# Patient Record
Sex: Male | Born: 1945 | Race: White | Hispanic: No | Marital: Married | Smoking: Never smoker
Health system: Southern US, Community
[De-identification: ages and names within clinical notes are randomized; demographics above are authoritative.]

## PROBLEM LIST (undated history)

## (undated) DIAGNOSIS — J45909 Unspecified asthma, uncomplicated: Secondary | ICD-10-CM

## (undated) DIAGNOSIS — N400 Enlarged prostate without lower urinary tract symptoms: Secondary | ICD-10-CM

## (undated) DIAGNOSIS — E7800 Pure hypercholesterolemia, unspecified: Secondary | ICD-10-CM

## (undated) HISTORY — PX: PROSTATE SURGERY: SHX751

## (undated) HISTORY — PX: CATARACT EXTRACTION: SUR2

---

## 2011-01-22 ENCOUNTER — Ambulatory Visit
Admission: RE | Admit: 2011-01-22 | Discharge: 2011-01-22 | Disposition: A | Discharge status: Home / Self Care | Payer: Medicare Other | Source: Ambulatory Visit | Attending: Orthopedic Surgery | Admitting: Orthopedic Surgery

## 2011-01-22 ENCOUNTER — Other Ambulatory Visit: Payer: Self-pay | Admitting: Orthopedic Surgery

## 2011-01-22 DIAGNOSIS — R52 Pain, unspecified: Secondary | ICD-10-CM

## 2011-02-11 DEATH — deceased

## 2012-06-09 IMAGING — CT CT CHEST W/O CM
2 of 5 series · 15 of 36 positions shown, 18 images · non-contrast
Comparison: None.

CLINICAL DATA: Left sternal and rib pain.  Evaluate for fracture.

CT CHEST WITHOUT CONTRAST
TECHNIQUE: Multidetector CT imaging of the chest was performed
following the standard protocol without IV contrast.

[Series 102: chest bone windows · axial · 0.70mm/px · z∈[-265,+2]mm · 12 of 127 slices shown, 15 images]
[im 10/127  mediastinal]
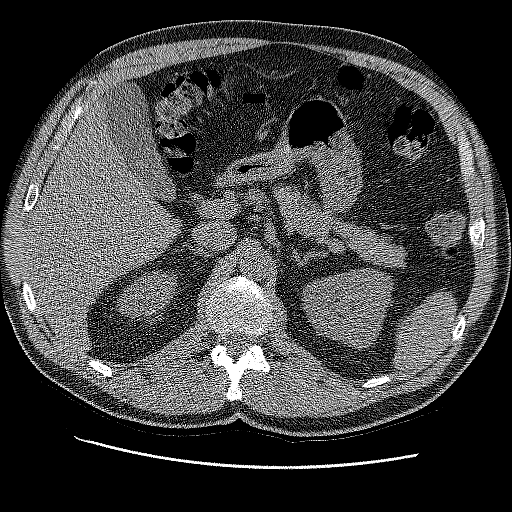
[im 10/127  lung]
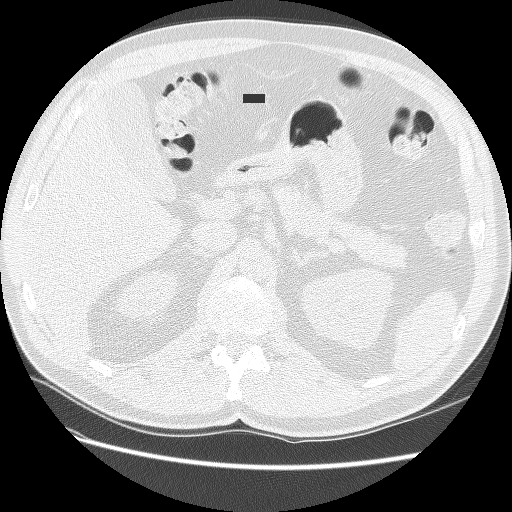
[im 20/127  lung]
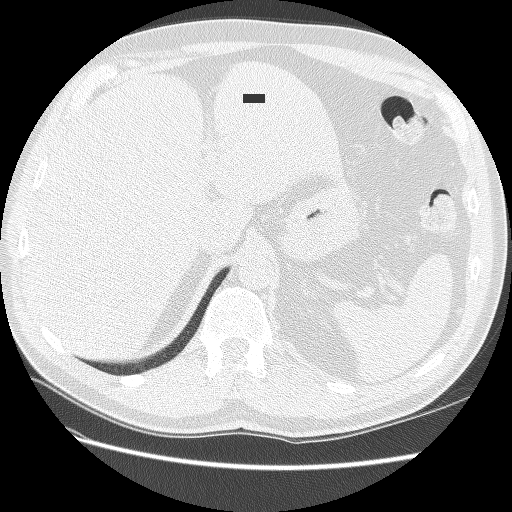
[im 30/127  lung]
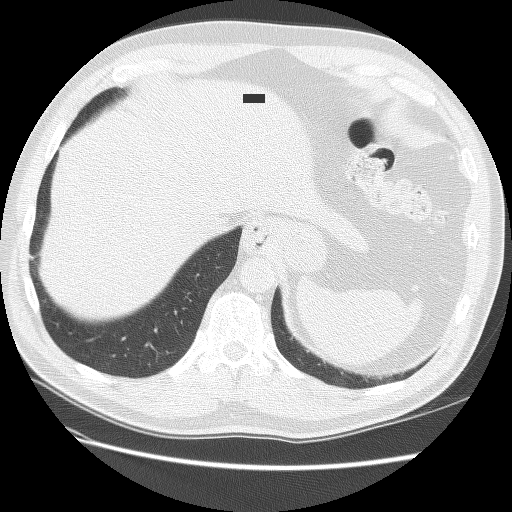
[im 39/127  lung]
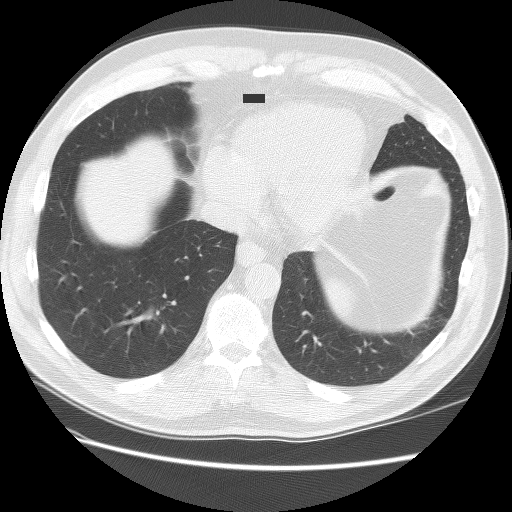
[im 49/127  mediastinal]
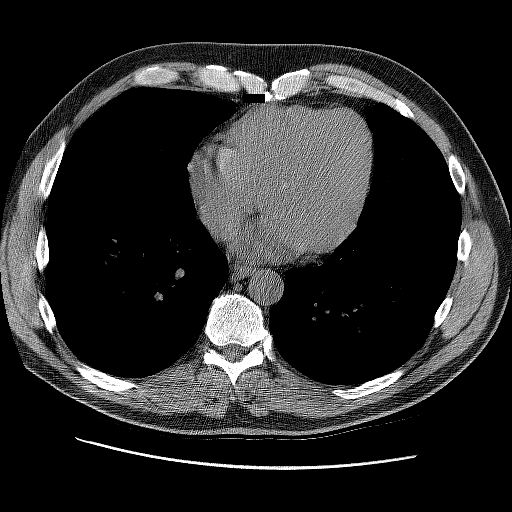
[im 49/127  lung]
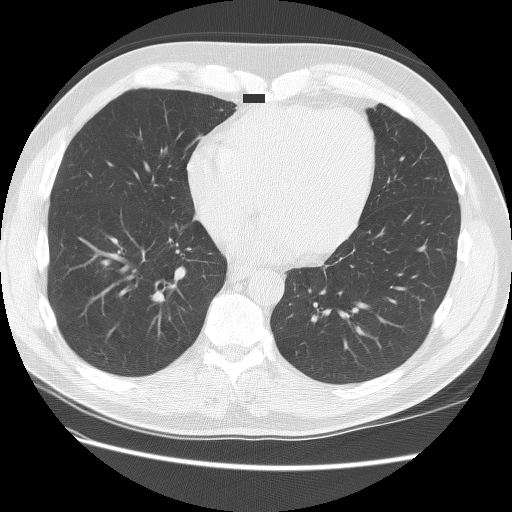
[im 59/127  lung]
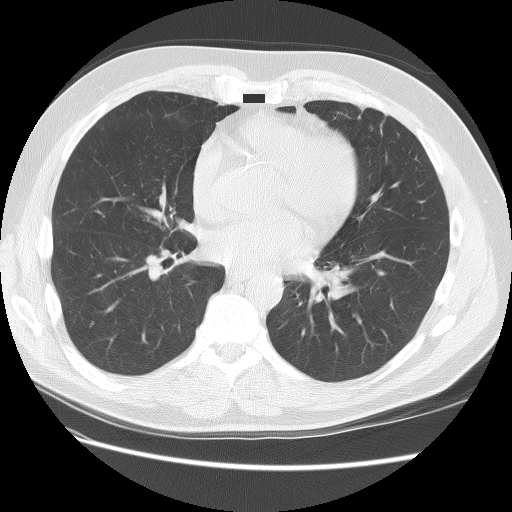
[im 68/127  lung]
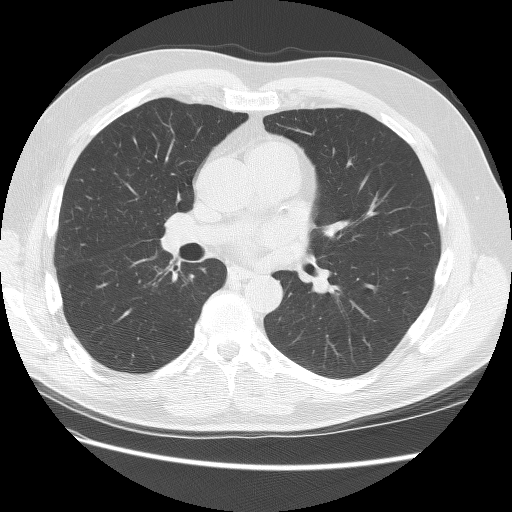
[im 78/127  lung]
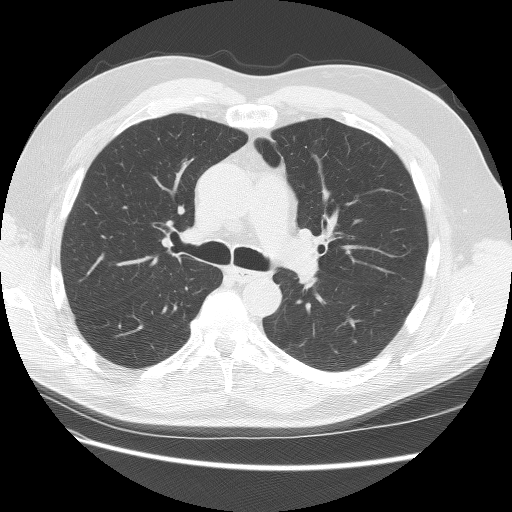
[im 88/127  mediastinal]
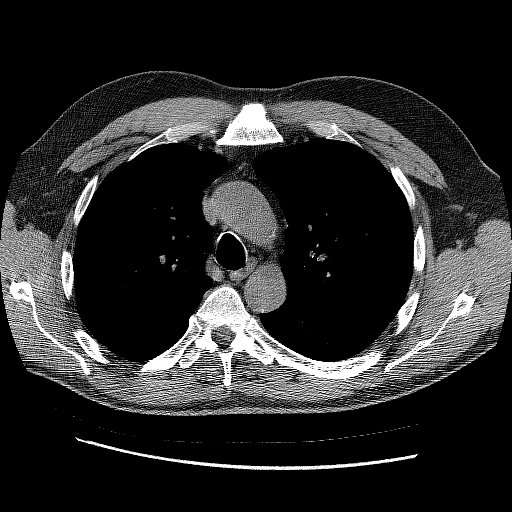
[im 88/127  lung]
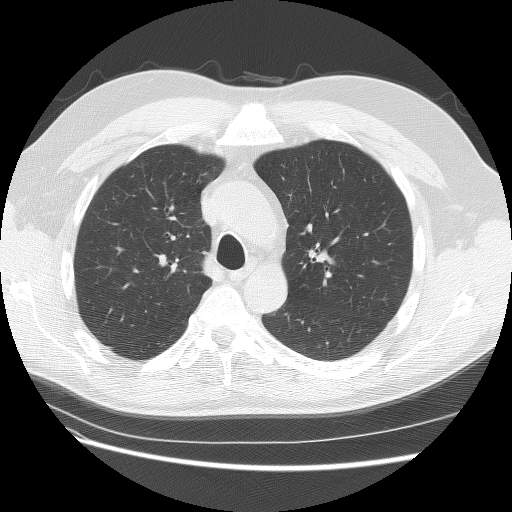
[im 97/127  lung]
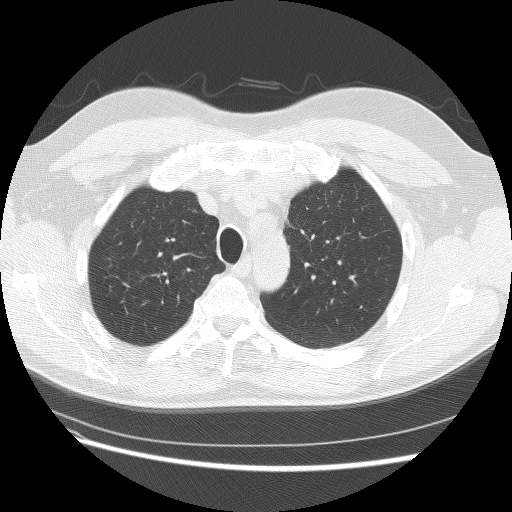
[im 107/127  lung]
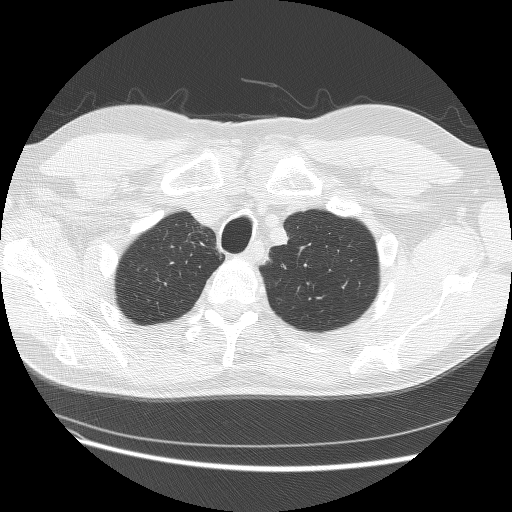
[im 117/127  lung]
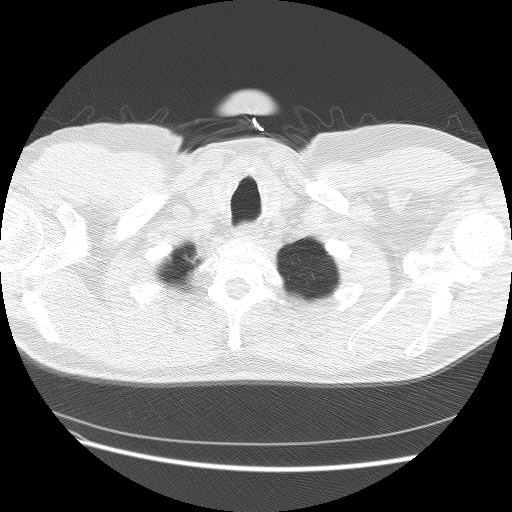

[Series 400: cor · coronal · 0.70mm/px · 3 of 118 slices shown]
[im 24/118  lung]
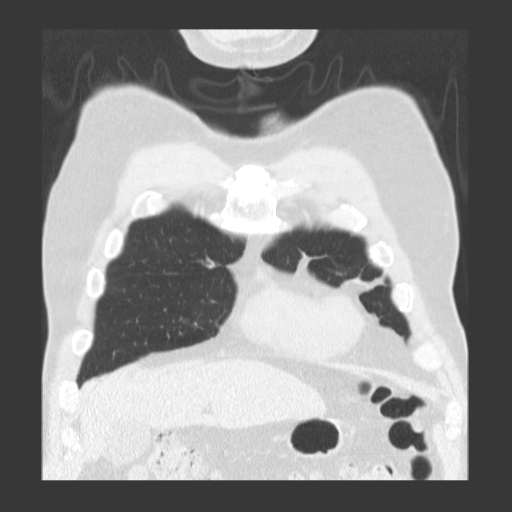
[im 47/118  lung]
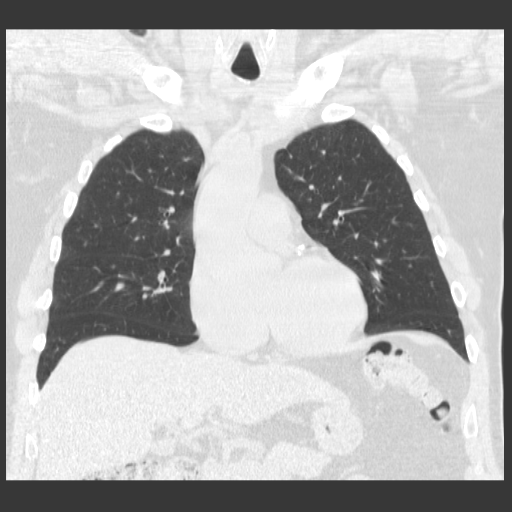
[im 71/118  lung]
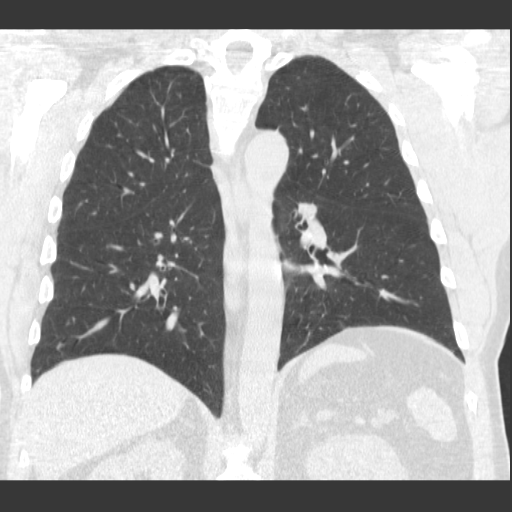

[15 of 36 positions shown; findings below may reference images not displayed]

FINDINGS: No pathologically enlarged mediastinal or axillary lymph
nodes.  Hilar regions are difficult to definitively evaluate
without IV contrast.  There is atherosclerotic calcification of the
coronary arteries, especially within the left anterior descending
artery.  Heart size normal.  No pericardial effusion.  Tiny hiatal
hernia.

Probable scarring in the right upper and right middle lobes.  Lungs
are otherwise clear.  No pleural fluid.  Airway is unremarkable.

Incidental imaging of the upper abdomen shows no acute findings.
No worrisome lytic or sclerotic lesions.  No fracture. Prominent
anterior osteophytosis is seen along the sternomanubrial junction.
IMPRESSION: 1.  No findings to explain the patient's given symptoms, other than
degenerative changes involving the sternomanubrial joint.
2.  Extensive left anterior descending coronary artery
calcification.

## 2013-10-12 DIAGNOSIS — G4733 Obstructive sleep apnea (adult) (pediatric): Secondary | ICD-10-CM | POA: Insufficient documentation

## 2013-10-12 DIAGNOSIS — H2510 Age-related nuclear cataract, unspecified eye: Secondary | ICD-10-CM | POA: Insufficient documentation

## 2013-10-12 DIAGNOSIS — IMO0001 Reserved for inherently not codable concepts without codable children: Secondary | ICD-10-CM | POA: Insufficient documentation

## 2013-10-12 DIAGNOSIS — I6529 Occlusion and stenosis of unspecified carotid artery: Secondary | ICD-10-CM | POA: Insufficient documentation

## 2013-10-12 DIAGNOSIS — K219 Gastro-esophageal reflux disease without esophagitis: Secondary | ICD-10-CM | POA: Insufficient documentation

## 2013-10-12 DIAGNOSIS — N411 Chronic prostatitis: Secondary | ICD-10-CM | POA: Insufficient documentation

## 2013-10-12 DIAGNOSIS — F339 Major depressive disorder, recurrent, unspecified: Secondary | ICD-10-CM | POA: Insufficient documentation

## 2013-10-12 DIAGNOSIS — H8110 Benign paroxysmal vertigo, unspecified ear: Secondary | ICD-10-CM | POA: Insufficient documentation

## 2013-10-12 DIAGNOSIS — H34239 Retinal artery branch occlusion, unspecified eye: Secondary | ICD-10-CM | POA: Insufficient documentation

## 2013-10-12 DIAGNOSIS — J9801 Acute bronchospasm: Secondary | ICD-10-CM | POA: Insufficient documentation

## 2013-10-12 DIAGNOSIS — G5760 Lesion of plantar nerve, unspecified lower limb: Secondary | ICD-10-CM | POA: Insufficient documentation

## 2013-10-12 DIAGNOSIS — H9190 Unspecified hearing loss, unspecified ear: Secondary | ICD-10-CM | POA: Insufficient documentation

## 2013-10-12 DIAGNOSIS — E669 Obesity, unspecified: Secondary | ICD-10-CM | POA: Insufficient documentation

## 2013-10-12 DIAGNOSIS — J309 Allergic rhinitis, unspecified: Secondary | ICD-10-CM | POA: Insufficient documentation

## 2013-10-12 DIAGNOSIS — R5383 Other fatigue: Secondary | ICD-10-CM | POA: Insufficient documentation

## 2013-10-12 DIAGNOSIS — B351 Tinea unguium: Secondary | ICD-10-CM | POA: Insufficient documentation

## 2013-10-12 DIAGNOSIS — R0989 Other specified symptoms and signs involving the circulatory and respiratory systems: Secondary | ICD-10-CM | POA: Insufficient documentation

## 2013-10-12 DIAGNOSIS — N138 Other obstructive and reflux uropathy: Secondary | ICD-10-CM | POA: Insufficient documentation

## 2013-10-12 DIAGNOSIS — N529 Male erectile dysfunction, unspecified: Secondary | ICD-10-CM | POA: Insufficient documentation

## 2013-10-12 DIAGNOSIS — R1310 Dysphagia, unspecified: Secondary | ICD-10-CM | POA: Insufficient documentation

## 2013-10-12 DIAGNOSIS — E785 Hyperlipidemia, unspecified: Secondary | ICD-10-CM | POA: Insufficient documentation

## 2013-10-12 DIAGNOSIS — H35369 Drusen (degenerative) of macula, unspecified eye: Secondary | ICD-10-CM | POA: Insufficient documentation

## 2013-10-12 DIAGNOSIS — R972 Elevated prostate specific antigen [PSA]: Secondary | ICD-10-CM | POA: Insufficient documentation

## 2013-10-12 DIAGNOSIS — J300 Vasomotor rhinitis: Secondary | ICD-10-CM | POA: Insufficient documentation

## 2013-10-12 DIAGNOSIS — J45901 Unspecified asthma with (acute) exacerbation: Secondary | ICD-10-CM | POA: Insufficient documentation

## 2013-10-12 DIAGNOSIS — N401 Enlarged prostate with lower urinary tract symptoms: Secondary | ICD-10-CM

## 2013-10-12 DIAGNOSIS — M205X9 Other deformities of toe(s) (acquired), unspecified foot: Secondary | ICD-10-CM | POA: Insufficient documentation

## 2013-10-12 DIAGNOSIS — Z9989 Dependence on other enabling machines and devices: Secondary | ICD-10-CM

## 2013-10-12 DIAGNOSIS — D649 Anemia, unspecified: Secondary | ICD-10-CM | POA: Insufficient documentation

## 2013-10-12 DIAGNOSIS — I10 Essential (primary) hypertension: Secondary | ICD-10-CM | POA: Insufficient documentation

## 2015-02-04 ENCOUNTER — Emergency Department (HOSPITAL_BASED_OUTPATIENT_CLINIC_OR_DEPARTMENT_OTHER)
Admission: EM | Admit: 2015-02-04 | Discharge: 2015-02-04 | Disposition: A | Discharge status: Home / Self Care | Payer: Medicare Other | Attending: Emergency Medicine | Admitting: Emergency Medicine

## 2015-02-04 ENCOUNTER — Encounter (HOSPITAL_BASED_OUTPATIENT_CLINIC_OR_DEPARTMENT_OTHER): Payer: Self-pay | Admitting: *Deleted

## 2015-02-04 DIAGNOSIS — Z79899 Other long term (current) drug therapy: Secondary | ICD-10-CM | POA: Diagnosis not present

## 2015-02-04 DIAGNOSIS — E7800 Pure hypercholesterolemia, unspecified: Secondary | ICD-10-CM | POA: Diagnosis not present

## 2015-02-04 DIAGNOSIS — J45909 Unspecified asthma, uncomplicated: Secondary | ICD-10-CM | POA: Diagnosis not present

## 2015-02-04 DIAGNOSIS — J069 Acute upper respiratory infection, unspecified: Secondary | ICD-10-CM | POA: Insufficient documentation

## 2015-02-04 DIAGNOSIS — B9789 Other viral agents as the cause of diseases classified elsewhere: Secondary | ICD-10-CM

## 2015-02-04 DIAGNOSIS — J029 Acute pharyngitis, unspecified: Secondary | ICD-10-CM | POA: Diagnosis present

## 2015-02-04 HISTORY — DX: Pure hypercholesterolemia, unspecified: E78.00

## 2015-02-04 HISTORY — DX: Unspecified asthma, uncomplicated: J45.909

## 2015-02-04 LAB — RAPID STREP SCREEN (MED CTR MEBANE ONLY): Streptococcus, Group A Screen (Direct): NEGATIVE

## 2015-02-04 MED ORDER — OXYMETAZOLINE HCL 0.05 % NA SOLN
1.0000 | Freq: Once | NASAL | Status: AC
  Administered 2015-02-04: 1 via NASAL
  Filled 2015-02-04: qty 15

## 2015-02-04 MED ORDER — DEXAMETHASONE 6 MG PO TABS
10.0000 mg | ORAL_TABLET | Freq: Once | ORAL | Status: AC
  Administered 2015-02-04: 10 mg via ORAL
  Filled 2015-02-04: qty 1

## 2015-02-04 NOTE — ED Notes (Signed)
Pt amb to room 5 with quick steady gait in nad. Pt reports "mild cough" x 2 weeks, last night cough worsened and he noticed a sore throat and pain with swallowing.

## 2015-02-04 NOTE — ED Provider Notes (Signed)
CSN: [id number]     Arrival date & time 02/04/15  0818 History   First MD Initiated Contact with Patient 02/04/15 0827     Chief Complaint  Patient presents with  . Sore Throat     (Consider location/radiation/quality/duration/timing/severity/associated sxs/prior Treatment) Patient is a 69 y.o. male presenting with pharyngitis. The history is provided by the patient.  Sore Throat This is a new problem. The current episode started 12 to 24 hours ago. The problem occurs constantly. The problem has been gradually improving. Pertinent negatives include no chest pain, no abdominal pain and no shortness of breath. Associated symptoms comments: Cough, nasal congestion. Nothing aggravates the symptoms. Nothing relieves the symptoms. He has tried nothing for the symptoms.    Past Medical History  Diagnosis Date  . Asthma   . Hypercholesteremia    History reviewed. No pertinent past surgical history. History reviewed. No pertinent family history. Social History  Substance Use Topics  . Smoking status: Never Smoker   . Smokeless tobacco: None  . Alcohol Use: None    Review of Systems  Respiratory: Negative for shortness of breath.   Cardiovascular: Negative for chest pain.  Gastrointestinal: Negative for abdominal pain.  All other systems reviewed and are negative.     Allergies  Review of patient's allergies indicates no known allergies.  Home Medications   Prior to Admission medications   Medication Sig Start Date End Date Taking? Authorizing Provider  lansoprazole (PREVACID) 15 MG capsule Take 15 mg by mouth daily at 12 noon.   Yes Historical Provider, MD  rosuvastatin (CRESTOR) 20 MG tablet Take 20 mg by mouth daily.   Yes Historical Provider, MD   BP 153/85 mmHg  Pulse 80  Temp(Src) 98.7 F (37.1 C) (Oral)  Resp 16  Ht  (1.753 m)  Wt 194 lb (87.998 kg)  BMI 28.64 kg/m2  SpO2 98% Physical Exam  Constitutional: He is oriented to person, place, and time. He  appears well-developed and well-nourished. No distress.  HENT:  Head: Normocephalic and atraumatic.  Mouth/Throat: Uvula is midline, oropharynx is clear and moist and mucous membranes are normal. No oropharyngeal exudate, posterior oropharyngeal edema, posterior oropharyngeal erythema or tonsillar abscesses.  Eyes: Conjunctivae are normal.  Neck: Neck supple. No tracheal deviation present.  Cardiovascular: Normal rate, regular rhythm and normal heart sounds.   Pulmonary/Chest: Effort normal and breath sounds normal. No respiratory distress.  Abdominal: Soft. He exhibits no distension.  Neurological: He is alert and oriented to person, place, and time.  Skin: Skin is warm and dry.  Psychiatric: He has a normal mood and affect.    ED Course  Procedures (including critical care time) Labs Review Labs Reviewed  RAPID STREP SCREEN (NOT AT [facility])    Imaging Review No results found. I have personally reviewed and evaluated these images and lab results as part of my medical decision-making.   EKG Interpretation None      MDM   Final diagnoses:  Viral URI with cough    69 year old male presents with cough, nasal congestion over the last week. Last night he noticed he had a very severe sore throat which appears to be resolving. Signs and symptoms consistent with postnasal drip. Provided Afrin for nasal congestion over the next 3 days. Dose of Decadron provided for sore throat. No signs of respiratory distress, non-toxic appearing, CTAB, no concern for pneumonia with this clinical picture. No emergent testing indicated at this time. Pt discharged with likely viral cough which will be  self limited in its course.     Lyndal Pulleyaniel Rudransh Bellanca, MD 02/04/15 [phone]

## 2015-02-04 NOTE — Discharge Instructions (Signed)
Upper Respiratory Infection, Adult Most upper respiratory infections (URIs) are a viral infection of the air passages leading to the lungs. A URI affects the nose, throat, and upper air passages. The most common type of URI is nasopharyngitis and is typically referred to as "the common cold." URIs run their course and usually go away on their own. Most of the time, a URI does not require medical attention, but sometimes a bacterial infection in the upper airways can follow a viral infection. This is called a secondary infection. Sinus and middle ear infections are common types of secondary upper respiratory infections. Bacterial pneumonia can also complicate a URI. A URI can worsen asthma and chronic obstructive pulmonary disease (COPD). Sometimes, these complications can require emergency medical care and may be life threatening.  CAUSES Almost all URIs are caused by viruses. A virus is a type of germ and can spread from one person to another.  RISKS FACTORS You may be at risk for a URI if:   You smoke.   You have chronic heart or lung disease.  You have a weakened defense (immune) system.   You are very young or very old.   You have nasal allergies or asthma.  You work in crowded or poorly ventilated areas.  You work in health care facilities or schools. SIGNS AND SYMPTOMS  Symptoms typically develop 2-3 days after you come in contact with a cold virus. Most viral URIs last 7-10 days. However, viral URIs from the influenza virus (flu virus) can last 14-18 days and are typically more severe. Symptoms may include:   Runny or stuffy (congested) nose.   Sneezing.   Cough.   Sore throat.   Headache.   Fatigue.   Fever.   Loss of appetite.   Pain in your forehead, behind your eyes, and over your cheekbones (sinus pain).  Muscle aches.  DIAGNOSIS  Your health care provider may diagnose a URI by:  Physical exam.  Tests to check that your symptoms are not due to  another condition such as:  Strep throat.  Sinusitis.  Pneumonia.  Asthma. TREATMENT  A URI goes away on its own with time. It cannot be cured with medicines, but medicines may be prescribed or recommended to relieve symptoms. Medicines may help:  Reduce your fever.  Reduce your cough.  Relieve nasal congestion. HOME CARE INSTRUCTIONS   Take medicines only as directed by your health care provider.   Gargle warm saltwater or take cough drops to comfort your throat as directed by your health care provider.  Use a warm mist humidifier or inhale steam from a shower to increase air moisture. This may make it easier to breathe.  Drink enough fluid to keep your urine clear or pale yellow.   Eat soups and other clear broths and maintain good nutrition.   Rest as needed.   Return to work when your temperature has returned to normal or as your health care provider advises. You may need to stay home longer to avoid infecting others. You can also use a face mask and careful hand washing to prevent spread of the virus.  Increase the usage of your inhaler if you have asthma.   Do not use any tobacco products, including cigarettes, chewing tobacco, or electronic cigarettes. If you need help quitting, ask your health care provider. PREVENTION  The best way to protect yourself from getting a cold is to practice good hygiene.   Avoid oral or hand contact with people with cold   symptoms.   Wash your hands often if contact occurs.  There is no clear evidence that vitamin C, vitamin E, echinacea, or exercise reduces the chance of developing a cold. However, it is always recommended to get plenty of rest, exercise, and practice good nutrition.  SEEK MEDICAL CARE IF:   You are getting worse rather than better.   Your symptoms are not controlled by medicine.   You have chills.  You have worsening shortness of breath.  You have brown or red mucus.  You have yellow or brown nasal  discharge.  You have pain in your face, especially when you bend forward.  You have a fever.  You have swollen neck glands.  You have pain while swallowing.  You have white areas in the back of your throat. SEEK IMMEDIATE MEDICAL CARE IF:   You have severe or persistent:  Headache.  Ear pain.  Sinus pain.  Chest pain.  You have chronic lung disease and any of the following:  Wheezing.  Prolonged cough.  Coughing up blood.  A change in your usual mucus.  You have a stiff neck.  You have changes in your:  Vision.  Hearing.  Thinking.  Mood. MAKE SURE YOU:   Understand these instructions.  Will watch your condition.  Will get help right away if you are not doing well or get worse.   This information is not intended to replace advice given to you by your health care provider. Make sure you discuss any questions you have with your health care provider.   Document Released: 07/23/2000 Document Revised: 06/13/2014 Document Reviewed: 05/04/2013 Elsevier Interactive Patient Education 2016 [employer]vier Inc.  

## 2015-02-06 LAB — CULTURE, GROUP A STREP: STREP A CULTURE: NEGATIVE

## 2015-05-14 DIAGNOSIS — M2022 Hallux rigidus, left foot: Secondary | ICD-10-CM | POA: Insufficient documentation

## 2015-05-14 DIAGNOSIS — M2021 Hallux rigidus, right foot: Secondary | ICD-10-CM | POA: Insufficient documentation

## 2015-05-14 DIAGNOSIS — G629 Polyneuropathy, unspecified: Secondary | ICD-10-CM | POA: Insufficient documentation

## 2015-06-19 DIAGNOSIS — Z0000 Encounter for general adult medical examination without abnormal findings: Secondary | ICD-10-CM | POA: Insufficient documentation

## 2015-07-13 DIAGNOSIS — E291 Testicular hypofunction: Secondary | ICD-10-CM | POA: Insufficient documentation

## 2016-02-18 DIAGNOSIS — H9191 Unspecified hearing loss, right ear: Secondary | ICD-10-CM | POA: Insufficient documentation

## 2016-05-13 DIAGNOSIS — J301 Allergic rhinitis due to pollen: Secondary | ICD-10-CM | POA: Insufficient documentation

## 2016-06-09 ENCOUNTER — Ambulatory Visit (INDEPENDENT_AMBULATORY_CARE_PROVIDER_SITE_OTHER): Payer: Medicare Other | Admitting: Pediatrics

## 2016-06-09 ENCOUNTER — Encounter: Payer: Self-pay | Admitting: Pediatrics

## 2016-06-09 VITALS — BP 138/84 | HR 68 | Temp 98.9°F | Resp 18 | Ht 69.0 in | Wt 194.8 lb

## 2016-06-09 DIAGNOSIS — G4733 Obstructive sleep apnea (adult) (pediatric): Secondary | ICD-10-CM | POA: Diagnosis not present

## 2016-06-09 DIAGNOSIS — K219 Gastro-esophageal reflux disease without esophagitis: Secondary | ICD-10-CM

## 2016-06-09 DIAGNOSIS — J301 Allergic rhinitis due to pollen: Secondary | ICD-10-CM | POA: Diagnosis not present

## 2016-06-09 DIAGNOSIS — J4530 Mild persistent asthma, uncomplicated: Secondary | ICD-10-CM

## 2016-06-09 DIAGNOSIS — Z9989 Dependence on other enabling machines and devices: Secondary | ICD-10-CM

## 2016-06-09 MED ORDER — ALBUTEROL SULFATE HFA 108 (90 BASE) MCG/ACT IN AERS
2.0000 | INHALATION_SPRAY | RESPIRATORY_TRACT | 5 refills | Status: AC | PRN
Start: 2016-06-09 — End: ?

## 2016-06-09 MED ORDER — FLUTICASONE PROPIONATE 50 MCG/ACT NA SUSP: 1.0000 | Freq: Two times a day (BID) | NASAL | 5 refills | Status: DC

## 2016-06-09 MED ORDER — MONTELUKAST SODIUM 10 MG PO TABS: 10.0000 mg | ORAL_TABLET | Freq: Every day | ORAL | 5 refills | Status: DC

## 2016-06-09 NOTE — Patient Instructions (Addendum)
Environmental control of dust and mold Zyrtec 10 mg once a day for runny nose or itchy eyes Polysporin ointment twice a day in the right nostril for 1 week In one week add  fluticasone 1 spray per nostril twice a day for stuffy nose Montelukast  10 mg-take 1 tablet once a day for coughing or wheezing Ventolin 2 puffs every 4 hours if needed for wheezing or coughing spells. You may use 5-15  minutes before exercise Make sure  you get flu vaccinations every fall. A HEPA filter in the bedroom would help with the dog allergy Call me if you are not doing better on this treatment plan

## 2016-06-09 NOTE — Progress Notes (Signed)
[address] [place] [address] Dept: [phone]  New Patient Note  Patient ID: Lawrence Giles, male    DOB: [birth year]  Age: 71 y.o. MRN: [id number] Date of Office Visit: 06/09/2016 Referring provider: Elijio Miles, MD [address] [place], [place] [address]    Chief Complaint: Asthma (Asthma as a child.  Sx began November 2016.); Wheezing; and Cough  HPI Lawrence Giles presents for evaluation of asthma and allergic rhinitis. He had severe asthma in childhood and took allergy injections while in college. His asthma did improve significantly. In November 2016 he had asthmatic symptoms for about 2 weeks. Last fall he had asthmatic symptoms for about 7 weeks. At times he does have nasal congestion aggravated by exposure to dust. He has gastroesophageal reflux and takes medications. He has obstructive sleep apnea and uses CPAP. He was treated with Augmentin and prednisone the springtime because of asthmatic symptoms. He has improved but at times he does have a cough.. He has not had eczema or chronic urticaria. He has not had pneumonia  Review of Systems  Constitutional: Negative.   HENT:       Nasal congestion off and on for several years. Obstructive sleep apnea requiring CPAP. History of benign positional vertigo  Eyes:       Mild glaucoma. Early cataracts  Respiratory:       History of asthma. Worsening in the last 2 years in the fall of the year and this  springtime  Cardiovascular: Negative.   Gastrointestinal:       Gastroesophageal reflux  Genitourinary:       Enlarged prostate with  elevated PSA. Hypogonadism requiring testosterone  Skin: Negative.   Neurological: Negative.   Endo/Heme/Allergies:       No diabetes or thyroid disease  Psychiatric/Behavioral: Negative.     Outpatient Encounter Prescriptions as of 06/09/2016  Medication Sig  . ALBERTSONS FERROUS SULFATE PO Take 325 mg by mouth.  Marland Kitchen albuterol (PROVENTIL HFA;VENTOLIN HFA) 108 (90 Base) MCG/ACT inhaler  Inhale 2 puffs into the lungs every 4 (four) hours as needed for wheezing or shortness of breath.  Marland Kitchen aspirin EC 81 MG tablet Take 81 mg by mouth.  . LUMIGAN 0.01 % SOLN   . meclizine (ANTIVERT) 25 MG tablet TAKE 1 TABLET 4 TIMES DAILY AS NEEDED FOR VERTIGO  . omeprazole (PRILOSEC) 20 MG capsule daily.  . rosuvastatin (CRESTOR) 10 MG tablet TAKE 1 TABLET AT BEDTIME.  . silodosin (RAPAFLO) 8 MG CAPS capsule Take 8 mg by mouth.  . testosterone cypionate (DEPOTESTOSTERONE CYPIONATE) 200 MG/ML injection Inject 200 mg into the muscle.  . Turmeric Curcumin 500 MG CAPS Take by mouth.  Marland Kitchen UNABLE TO FIND CPAP  . vitamin B-12 (CYANOCOBALAMIN) 100 MCG tablet Take by mouth.  . zolpidem (AMBIEN) 10 MG tablet Take 10 mg by mouth.  . [DISCONTINUED] albuterol (PROVENTIL HFA;VENTOLIN HFA) 108 (90 Base) MCG/ACT inhaler Inhale into the lungs.  Marland Kitchen azelastine (ASTELIN) 0.1 % nasal spray Place into the nose.  . fluconazole (DIFLUCAN) 150 MG tablet   . fluticasone (FLONASE) 50 MCG/ACT nasal spray Place 1 spray into both nostrils 2 (two) times daily.  . montelukast (SINGULAIR) 10 MG tablet Take 1 tablet (10 mg total) by mouth at bedtime.  . Testosterone 20.25 MG/ACT (1.62%) GEL Place onto the skin.  . [DISCONTINUED] lansoprazole (PREVACID) 15 MG capsule Take 15 mg by mouth daily at 12 noon.  . [DISCONTINUED] montelukast (SINGULAIR) 10 MG tablet Take 10 mg by mouth.  . [  DISCONTINUED] rosuvastatin (CRESTOR) 20 MG tablet Take 20 mg by mouth daily.  . [DISCONTINUED] rosuvastatin (CRESTOR) 20 MG tablet Take by mouth.  . [DISCONTINUED] tamsulosin (FLOMAX) 0.4 MG CAPS capsule Take 0.4 mg by mouth.   No facility-administered encounter medications on file as of 06/09/2016.      Drug Allergies:  No Known Allergies  Family History: Kyndall's family history includes Allergic rhinitis in his sister.. Asthma in his sister. There is no family history of sinus problems, eczema, hives, food allergies, chronic bronchitis or  emphysema.  Social and environmental. They have a dog in the home. He is not exposed to cigarette smoking. He has never smoked cigarettes.  Physical Exam: BP 138/84 (BP Location: Right Arm, Patient Position: Sitting, Cuff Size: Normal)   Pulse 68   Temp 98.9 F (37.2 C) (Oral)   Resp 18   Ht  (1.753 m)   Wt 194 lb 12.8 oz (88.4 kg)   SpO2 97%   BMI 28.77 kg/m    Physical Exam  Constitutional: He is oriented to person, place, and time. He appears well-developed and well-nourished.  HENT:  Eyes normal. Ears normal. Nose moderate swelling of the nasal turbinates. There was a small excoriation in  the right nostril. Pharynx normal.  Neck: Neck supple. No thyromegaly present.  Cardiovascular:  S1 and S2 normal no murmurs  Pulmonary/Chest:  Clear to percussion and auscultation  Abdominal: Soft. There is no tenderness (no hepatosplenomegaly).  Lymphadenopathy:    He has no cervical adenopathy.  Neurological: He is alert and oriented to person, place, and time.  Skin:  Clear. A few small areas of folliculitis in the lower back  Psychiatric: He has a normal mood and affect. His behavior is normal. Judgment and thought content normal.  Vitals reviewed.   Diagnostics: FVC 3.61 L FEV1 2.29 L. Predicted FVC 4.21 L predicted FEV1 3.08 L. After albuterol 2 puffs FVC 3.48 L FEV1 2.58 L-this shows a mild reduction in the FEV1 with significant improvement after albuterol  Allergy skin tests were positive to grass pollen, ragweed, some molds, dog and cockroach on intradermal testing only   Assessment  Assessment and Plan: 1. Mild persistent asthma without complication   2. Seasonal allergic rhinitis due to pollen   3. Obstructive sleep apnea treated with continuous positive airway pressure (CPAP)   4. Gastroesophageal reflux disease without esophagitis     Meds ordered this encounter  Medications  . albuterol (PROVENTIL HFA;VENTOLIN HFA) 108 (90 Base) MCG/ACT inhaler    Sig:  Inhale 2 puffs into the lungs every 4 (four) hours as needed for wheezing or shortness of breath.    Dispense:  1 Inhaler    Refill:  5  . montelukast (SINGULAIR) 10 MG tablet    Sig: Take 1 tablet (10 mg total) by mouth at bedtime.    Dispense:  34 tablet    Refill:  5  . fluticasone (FLONASE) 50 MCG/ACT nasal spray    Sig: Place 1 spray into both nostrils 2 (two) times daily.    Dispense:  16 g    Refill:  5    Patient Instructions  Environmental control of dust and mold Zyrtec 10 mg once a day for runny nose or itchy eyes Polysporin ointment twice a day in the right nostril for 1 week In one week add  fluticasone 1 spray per nostril twice a day for stuffy nose Montelukast  10 mg-take 1 tablet once a day for coughing or wheezing Ventolin  2 puffs every 4 hours if needed for wheezing or coughing spells. You may use 5-15  minutes before exercise Make sure  you get flu vaccinations every fall. A HEPA filter in the bedroom would help with the dog allergy Call me if you are not doing better on this treatment plan   Return in about 6 weeks (around 07/21/2016).   Thank you for the opportunity to care for this patient.  Please do not hesitate to contact me with questions.  Tonette Bihari, M.D.  [facility] [address] [place], [place] [address] [phone]

## 2016-07-21 ENCOUNTER — Encounter: Payer: Self-pay | Admitting: Pediatrics

## 2016-07-21 ENCOUNTER — Ambulatory Visit (INDEPENDENT_AMBULATORY_CARE_PROVIDER_SITE_OTHER): Payer: Medicare Other | Admitting: Pediatrics

## 2016-07-21 VITALS — BP 130/78 | HR 70 | Temp 97.8°F | Resp 16

## 2016-07-21 DIAGNOSIS — K219 Gastro-esophageal reflux disease without esophagitis: Secondary | ICD-10-CM

## 2016-07-21 DIAGNOSIS — G4733 Obstructive sleep apnea (adult) (pediatric): Secondary | ICD-10-CM | POA: Diagnosis not present

## 2016-07-21 DIAGNOSIS — J4530 Mild persistent asthma, uncomplicated: Secondary | ICD-10-CM

## 2016-07-21 DIAGNOSIS — Z9989 Dependence on other enabling machines and devices: Secondary | ICD-10-CM

## 2016-07-21 DIAGNOSIS — J301 Allergic rhinitis due to pollen: Secondary | ICD-10-CM

## 2016-07-21 DIAGNOSIS — H4089 Other specified glaucoma: Secondary | ICD-10-CM | POA: Diagnosis not present

## 2016-07-21 NOTE — Progress Notes (Signed)
  [address] [place] [address] Dept: [phone]  FOLLOW UP NOTE  Patient ID: Lawrence BeneDavid Wisham, male    DOB: August 06, 1945  Age: [age] y.o. MRN: [id number] Date of Office Visit: 07/21/2016  Assessment  Chief Complaint: Allergies  HPI Lawrence Giles presents for follow-up of asthma and allergic rhinitis. His asthma is well controlled. When he ran out of omeprazole , he developed more clearing of his throat.Marland Kitchen. He stopped using fluticasone because he has glaucoma which worsened when he was on prednisone last fall. He would like to avoid nasal steroids. He  does not know if he has a wide angle or a angle glaucoma. He has more nasal congestion [place] the [place]  Current medications will be outlined in his after visit summary     Drug Allergies:  No Known Allergies  Physical Exam: BP 130/78   Pulse 70   Temp 97.8 F (36.6 C) (Oral)   Resp 16   SpO2 97%    Physical Exam  Constitutional: He appears well-developed and well-nourished.  HENT:  Eyes normal. Ears normal except for a tiny scab in the right ear canal close of the entrance. He noted some bleeding from a Q-tip about 2 weeks from the right ear. Nose mild swelling of the nasal turbinates. Pharynx normal.  Vitals reviewed.   Diagnostics:  FVC 3.04 L FEV1 2.17 L. Predicted FVC 4.21 L predicted FEV1 3.08 L-this shows a mild reduction in the forced vital capacity  Assessment and Plan: 1. Mild persistent asthma without complication   2. Gastroesophageal reflux disease without esophagitis   3. Other glaucoma of both eyes   4. Seasonal allergic rhinitis due to pollen   5. Obstructive sleep apnea on CPAP        Patient Instructions  Zyrtec 10 mg once a day for runny nose or itchy eyes Azelastine 0.1%-  2 sprays per nostril twice a day for runny nose Fluticasone 1 spray per nostril once or twice a day if needed for stuffy nose . Talk to your eye doctor regarding fluticasone use  when you have   severe nasal  congestion . A few days of use should be OK. If he needs prednisone because of an asthma exacerbation, I would  suggest using prednisone 10 mg twice a day for 4 days, 10 mg on the fifth day , then stop. Montelukast  10 mg once a day for coughing or wheezing Ventolin 2 puffs every 4 hours if needed for wheezing or coughing spells Call me if you're not doing well on this treatment plan Continue omeprazole 20 mg once or twice a day for reflux or  clearing of throat Have  Dr. Alben SpittleWeaver look at the right outer ear canal where you had some bleeding 2 weeks ago  Keep the dog out of the bedroom Keep the windows closed when you are in the mountains   Return in about 1 year (around 07/21/2017).    Thank you for the opportunity to care for this patient.  Please do not hesitate to contact me with questions.  Tonette BihariJ. A. Paulmichael Schreck, M.D.  [facility] [address] [place], [place] [address] [phone]

## 2016-07-21 NOTE — Patient Instructions (Addendum)
Zyrtec 10 mg once a day for runny nose or itchy eyes Azelastine 0.1%-  2 sprays per nostril twice a day for runny nose Fluticasone 1 spray per nostril once or twice a day if needed for stuffy nose . Talk to your eye doctor regarding fluticasone use  when you have   severe nasal congestion . A few days of use should be OK. If he needs prednisone because of an asthma exacerbation, I would  suggest using prednisone 10 mg twice a day for 4 days, 10 mg on the fifth day , then stop. Montelukast  10 mg once a day for coughing or wheezing Ventolin 2 puffs every 4 hours if needed for wheezing or coughing spells Call me if you're not doing well on this treatment plan Continue omeprazole 20 mg once or twice a day for reflux or  clearing of throat Have  Dr. Alben SpittleWeaver look at the right outer ear canal where you had some bleeding 2 weeks ago  Keep the dog out of the bedroom Keep the windows closed when you are in the mountains

## 2016-11-20 ENCOUNTER — Encounter (HOSPITAL_BASED_OUTPATIENT_CLINIC_OR_DEPARTMENT_OTHER): Payer: Self-pay

## 2016-11-20 ENCOUNTER — Emergency Department (HOSPITAL_BASED_OUTPATIENT_CLINIC_OR_DEPARTMENT_OTHER)
Admission: EM | Admit: 2016-11-20 | Discharge: 2016-11-20 | Disposition: A | Discharge status: Home / Self Care | Payer: Medicare Other | Attending: Emergency Medicine | Admitting: Emergency Medicine

## 2016-11-20 DIAGNOSIS — Z7982 Long term (current) use of aspirin: Secondary | ICD-10-CM | POA: Diagnosis not present

## 2016-11-20 DIAGNOSIS — J45909 Unspecified asthma, uncomplicated: Secondary | ICD-10-CM | POA: Diagnosis not present

## 2016-11-20 DIAGNOSIS — R339 Retention of urine, unspecified: Secondary | ICD-10-CM | POA: Diagnosis present

## 2016-11-20 HISTORY — DX: Benign prostatic hyperplasia without lower urinary tract symptoms: N40.0

## 2016-11-20 LAB — URINALYSIS, ROUTINE W REFLEX MICROSCOPIC
BILIRUBIN URINE: NEGATIVE
GLUCOSE, UA: NEGATIVE mg/dL
KETONES UR: NEGATIVE mg/dL
Leukocytes, UA: NEGATIVE
Nitrite: NEGATIVE
PH: 6 (ref 5.0–8.0)
Protein, ur: NEGATIVE mg/dL
Specific Gravity, Urine: <1.005 — ABNORMAL LOW (ref 1.005–1.030)

## 2016-11-20 LAB — URINALYSIS, MICROSCOPIC (REFLEX)

## 2016-11-20 NOTE — ED Provider Notes (Signed)
[facility] Provider Note   CSN: [id number] Arrival date & time: 11/20/16  1945     History   Chief Complaint Chief Complaint  Patient presents with  . Urinary Retention    HPI Lawrence Giles is a 71 y.o. male.  Patient with acute onset of urinary retention feeling about 2 hours ago. Patient known to have a history of enlarged prostate. Followed by urology in the [facility] area. No evidence of any prostate cancer. Patient's never had this happen before. Patient with the inability to PDA and very uncomfortable.      Past Medical History:  Diagnosis Date  . Asthma   . Enlarged prostate   . Hypercholesteremia     Patient Active Problem List   Diagnosis Date Noted  . Other specified glaucoma 07/21/2016  . Mild persistent asthma without complication 06/09/2016  . Seasonal allergic rhinitis due to pollen 05/13/2016  . Hearing loss of right ear 02/18/2016  . Hypogonadism in male 07/13/2015  . Encounter for health maintenance examination in adult 06/19/2015  . Acquired hallux rigidus of left foot 05/14/2015  . Acquired hallux rigidus of right foot 05/14/2015  . Polyneuropathy 05/14/2015  . Allergic rhinitis 10/12/2013  . Extrinsic asthma, with acute exacerbation 10/12/2013  . Atopic rhinitis 10/12/2013  . Essential hypertension 10/12/2013  . Benign paroxysmal positional vertigo 10/12/2013  . Benign prostatic hyperplasia with urinary obstruction 10/12/2013  . BPPV (benign paroxysmal positional vertigo) 10/12/2013  . 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 Bronchospasm 10/12/2013  . Carotid artery plaque 10/12/2013  . Carotid atherosclerosis 10/12/2013  . Carotid bruit 10/12/2013  . Chronic prostatitis 10/12/2013  . Degenerative drusen 10/12/2013  . Onychomycosis due to dermatophyte 10/12/2013  . Dysphagia 10/12/2013  . Elevated prostate specific antigen (PSA) 10/12/2013  . Esophageal reflux 10/12/2013  . Fatigue 10/12/2013  . Gastroesophageal  reflux disease 10/12/2013  . Hallux limitus 10/12/2013  . Hearing loss 10/12/2013  . Hyperlipemia 10/12/2013  . Hyperlipidemia LDL goal <70 10/12/2013  . Low hemoglobin 10/12/2013  . Major depressive disorder, recurrent episode (HCC) 10/12/2013  . Morton's metatarsalgia 10/12/2013  . Morton's neuroma 10/12/2013  . Myalgia and myositis 10/12/2013  . Nuclear senile cataract 10/12/2013  . Obesity 10/12/2013  . Obstructive sleep apnea syndrome 10/12/2013  . 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 Obstructive sleep apnea on CPAP 10/12/2013  . Recurrent major depressive episodes (HCC) 10/12/2013  . Senile nuclear cataract 10/12/2013  . Vasomotor rhinitis 10/12/2013    History reviewed. No pertinent surgical history.     Home Medications    Prior to Admission medications   Medication Sig Start Date End Date Taking? Authorizing Provider  ALBERTSONS FERROUS SULFATE PO Take 325 mg by mouth.    [provider]  albuterol (PROVENTIL HFA;VENTOLIN HFA) 108 (90 Base) MCG/ACT inhaler Inhale 2 puffs into the lungs every 4 (four) hours as needed for wheezing or shortness of breath. 06/09/16   Fletcher Anon, MD  aspirin EC 81 MG tablet Take 81 mg by mouth.    [provider]  azelastine (ASTELIN) 0.1 % nasal spray Place into the nose. 05/13/16   [provider]  cetirizine (ZYRTEC) 10 MG tablet Take 10 mg by mouth daily.    [provider]  fluconazole (DIFLUCAN) 150 MG tablet  04/08/16   [provider]  LUMIGAN 0.01 % SOLN  05/28/16   [provider]  meclizine (ANTIVERT) 25 MG tablet TAKE 1 TABLET 4 TIMES DAILY AS NEEDED FOR VERTIGO 05/05/16  [provider]  montelukast (SINGULAIR) 10 MG tablet Take 1 tablet (10 mg total) by mouth at bedtime. 06/09/16   Fletcher Anon, MD  omeprazole (PRILOSEC) 20 MG capsule daily. 01/11/15   [provider]  rosuvastatin (CRESTOR) 10 MG tablet TAKE 1 TABLET AT BEDTIME. 04/21/16   [provider]  silodosin (RAPAFLO) 8 MG CAPS capsule Take 8 mg by mouth. 01/08/16   [provider]  testosterone cypionate (DEPOTESTOSTERONE CYPIONATE) 200 MG/ML injection Inject 200 mg into the muscle. 07/13/15   [provider]  Turmeric Curcumin 500 MG CAPS Take by mouth.    [provider]  UNABLE TO FIND CPAP    [provider]  vitamin B-12 (CYANOCOBALAMIN) 100 MCG tablet Take by mouth.    [provider]  zolpidem (AMBIEN) 10 MG tablet Take 10 mg by mouth.    [provider]    Family History Family History  Problem Relation Age of Onset  . Allergic rhinitis Sister   . Angioedema Neg Hx   . Asthma Neg Hx   . Eczema Neg Hx   . Immunodeficiency Neg Hx   . Urticaria Neg Hx     Social History Social History  Substance Use Topics  . Smoking status: Never Smoker  . Smokeless tobacco: Never Used  . Alcohol use Yes     Comment: daily     Allergies   Patient has no known allergies.   Review of Systems Review of Systems  Constitutional: Negative for fever.  HENT: Negative for congestion.   Respiratory: Negative for shortness of breath.   Cardiovascular: Negative for chest pain.  Gastrointestinal: Positive for abdominal pain.  Genitourinary: Positive for difficulty urinating. Negative for hematuria.  Musculoskeletal: Negative for back pain.  Skin: Negative for rash.  Neurological: Negative for headaches.  Hematological: Does not bruise/bleed easily.  Psychiatric/Behavioral: Negative for confusion.     Physical Exam Updated Vital Signs BP (!) 198/115 (BP Location: Left Arm)   Pulse (!) 101   Temp 98.2 F (36.8 C) (Oral)   Resp 18   Ht 1.753 m ( )   Wt 87.1 kg (192 lb)   SpO2 96%   BMI 28.35 kg/m   Physical Exam  Constitutional: He is oriented to person, place, and time. He appears well-developed and well-nourished. He appears distressed.  HENT:  Head: Normocephalic and atraumatic.  Mouth/Throat:  Oropharynx is clear and moist.  Eyes: Pupils are equal, round, and reactive to light. Conjunctivae and EOM are normal.  Neck: Normal range of motion. Neck supple.  Cardiovascular: Normal rate and regular rhythm.   Pulmonary/Chest: Effort normal and breath sounds normal. No respiratory distress.  Abdominal: Soft. Bowel sounds are normal. He exhibits distension. There is no tenderness.  Bladder distended and palpable.  Genitourinary: Penis normal.  Musculoskeletal: Normal range of motion.  Neurological: He is alert and oriented to person, place, and time. No cranial nerve deficit or sensory deficit. He exhibits normal muscle tone. Coordination normal.  Skin: Skin is warm.  Nursing note and vitals reviewed.    ED Treatments / Results  Labs (all labs ordered are listed, but only abnormal results are displayed) Labs Reviewed  URINALYSIS, ROUTINE W REFLEX MICROSCOPIC - Abnormal; Notable for the following:       Result Value   Specific Gravity, Urine <1.005 (*)    Hgb urine dipstick LARGE (*)    All other components within normal limits  URINALYSIS, MICROSCOPIC (REFLEX) - Abnormal; Notable for the  following:    Bacteria, UA FEW (*)    Squamous Epithelial / LPF 0-5 (*)    All other components within normal limits    EKG  EKG Interpretation None       Radiology No results found.  Procedures Procedures (including critical care time)  Medications Ordered in ED Medications - No data to display   Initial Impression / Assessment and Plan / ED Course  I have reviewed the triage vital signs and the nursing notes.  Pertinent labs & imaging results that were available during my care of the patient were reviewed by me and considered in my medical decision making (see chart for details).     Bladder scan showed about 900 mL of urine in the bladder. Foley catheter placed. Patient was significant relief. Urinalysis not consistent with urinary tract infection. Urine not not grossly  bloody. Patient has urology to follow-up with. Will switch over to leg bag and have him follow-up with urology.  Final Clinical Impressions(s) / ED Diagnoses   Final diagnoses:  Urinary retention    New Prescriptions New Prescriptions   No medications on file     Vanetta Mulders, MD 11/20/16 2138

## 2016-11-20 NOTE — Discharge Instructions (Signed)
Drain the leg bag when he gets full. Make appointment to follow-up with your urologist. Keep the leg bag and Foley catheter in place until urology takes it out.

## 2016-11-20 NOTE — ED Triage Notes (Signed)
C/o urinary retention, enlarged prostate-grimacing pain-steady gait

## 2016-11-20 NOTE — ED Notes (Signed)
ED Provider at bedside. 

## 2017-01-05 ENCOUNTER — Other Ambulatory Visit: Payer: Self-pay

## 2017-01-05 MED ORDER — MONTELUKAST SODIUM 10 MG PO TABS: 10.0000 mg | ORAL_TABLET | Freq: Every day | ORAL | 0 refills | Status: AC

## 2017-01-05 NOTE — Telephone Encounter (Signed)
RF for montelukast x 1 with no refills, pt needs an OV 

## 2017-06-25 ENCOUNTER — Other Ambulatory Visit: Payer: Self-pay | Admitting: Allergy

## 2017-06-25 MED ORDER — ALBUTEROL SULFATE HFA 108 (90 BASE) MCG/ACT IN AERS: 2.0000 | INHALATION_SPRAY | RESPIRATORY_TRACT | 1 refills | Status: AC | PRN

## 2023-07-12 ENCOUNTER — Other Ambulatory Visit: Payer: Self-pay

## 2023-07-12 ENCOUNTER — Encounter (HOSPITAL_BASED_OUTPATIENT_CLINIC_OR_DEPARTMENT_OTHER): Payer: Self-pay | Admitting: Emergency Medicine

## 2023-07-12 ENCOUNTER — Emergency Department (HOSPITAL_BASED_OUTPATIENT_CLINIC_OR_DEPARTMENT_OTHER)
Admission: EM | Admit: 2023-07-12 | Discharge: 2023-07-13 | Disposition: A | Discharge status: Home / Self Care | Attending: Emergency Medicine | Admitting: Emergency Medicine

## 2023-07-12 ENCOUNTER — Emergency Department (HOSPITAL_BASED_OUTPATIENT_CLINIC_OR_DEPARTMENT_OTHER)

## 2023-07-12 DIAGNOSIS — R109 Unspecified abdominal pain: Secondary | ICD-10-CM | POA: Diagnosis present

## 2023-07-12 DIAGNOSIS — N132 Hydronephrosis with renal and ureteral calculous obstruction: Secondary | ICD-10-CM | POA: Diagnosis not present

## 2023-07-12 DIAGNOSIS — D72829 Elevated white blood cell count, unspecified: Secondary | ICD-10-CM | POA: Insufficient documentation

## 2023-07-12 DIAGNOSIS — J45909 Unspecified asthma, uncomplicated: Secondary | ICD-10-CM | POA: Diagnosis not present

## 2023-07-12 DIAGNOSIS — N200 Calculus of kidney: Secondary | ICD-10-CM

## 2023-07-12 DIAGNOSIS — I10 Essential (primary) hypertension: Secondary | ICD-10-CM | POA: Diagnosis not present

## 2023-07-12 DIAGNOSIS — Z79899 Other long term (current) drug therapy: Secondary | ICD-10-CM | POA: Diagnosis not present

## 2023-07-12 LAB — URINALYSIS, ROUTINE W REFLEX MICROSCOPIC
Bilirubin Urine: NEGATIVE
Glucose, UA: NEGATIVE mg/dL
Ketones, ur: 15 mg/dL — AB
Leukocytes,Ua: NEGATIVE
Nitrite: NEGATIVE
Protein, ur: 30 mg/dL — AB
Specific Gravity, Urine: >=1.03 (ref 1.005–1.030)
pH: 5.5 (ref 5.0–8.0)

## 2023-07-12 LAB — COMPREHENSIVE METABOLIC PANEL WITH GFR
ALT: 27 U/L (ref 0–44)
AST: 29 U/L (ref 15–41)
Albumin: 4.7 g/dL (ref 3.5–5.0)
Alkaline Phosphatase: 98 U/L (ref 38–126)
Anion gap: 14 (ref 5–15)
BUN: 29 mg/dL — ABNORMAL HIGH (ref 8–23)
CO2: 23 mmol/L (ref 22–32)
Calcium: 9.5 mg/dL (ref 8.9–10.3)
Chloride: 105 mmol/L (ref 98–111)
Creatinine, Ser: 1.11 mg/dL (ref 0.61–1.24)
GFR, Estimated: >60 mL/min (ref >60)
Glucose, Bld: 120 mg/dL — ABNORMAL HIGH (ref 70–99)
Potassium: 3.7 mmol/L (ref 3.5–5.1)
Sodium: 142 mmol/L (ref 135–145)
Total Bilirubin: 0.9 mg/dL (ref 0.0–1.2)
Total Protein: 7.5 g/dL (ref 6.5–8.1)

## 2023-07-12 LAB — CBC WITH DIFFERENTIAL/PLATELET
Abs Immature Granulocytes: 0.08 10*3/uL — ABNORMAL HIGH (ref 0.00–0.07)
Basophils Absolute: 0.1 10*3/uL (ref 0.0–0.1)
Basophils Relative: 0 %
Eosinophils Absolute: 0.1 10*3/uL (ref 0.0–0.5)
Eosinophils Relative: 0 %
HCT: 42.1 % (ref 39.0–52.0)
Hemoglobin: 14.7 g/dL (ref 13.0–17.0)
Immature Granulocytes: 1 %
Lymphocytes Relative: 8 %
Lymphs Abs: 1.3 10*3/uL (ref 0.7–4.0)
MCH: 31.6 pg (ref 26.0–34.0)
MCHC: 34.9 g/dL (ref 30.0–36.0)
MCV: 90.5 fL (ref 80.0–100.0)
Monocytes Absolute: 1.1 10*3/uL — ABNORMAL HIGH (ref 0.1–1.0)
Monocytes Relative: 7 %
Neutro Abs: 13.6 10*3/uL — ABNORMAL HIGH (ref 1.7–7.7)
Neutrophils Relative %: 84 %
Platelets: 235 10*3/uL (ref 150–400)
RBC: 4.65 MIL/uL (ref 4.22–5.81)
RDW: 12.6 % (ref 11.5–15.5)
WBC: 16.1 10*3/uL — ABNORMAL HIGH (ref 4.0–10.5)
nRBC: 0 % (ref 0.0–0.2)

## 2023-07-12 LAB — URINALYSIS, MICROSCOPIC (REFLEX)

## 2023-07-12 LAB — LIPASE, BLOOD: Lipase: 20 U/L (ref 11–51)

## 2023-07-12 MED ORDER — HYDRALAZINE HCL 20 MG/ML IJ SOLN
5.0000 mg | Freq: Once | INTRAMUSCULAR | Status: AC
  Administered 2023-07-12: 5 mg via INTRAVENOUS
  Filled 2023-07-12: qty 1

## 2023-07-12 MED ORDER — LOSARTAN POTASSIUM 25 MG PO TABS
50.0000 mg | ORAL_TABLET | Freq: Once | ORAL | Status: AC
  Administered 2023-07-12: 50 mg via ORAL
  Filled 2023-07-12: qty 2

## 2023-07-12 MED ORDER — TAMSULOSIN HCL 0.4 MG PO CAPS: 0.4000 mg | ORAL_CAPSULE | Freq: Every day | ORAL | 0 refills | Status: AC

## 2023-07-12 MED ORDER — ONDANSETRON HCL 4 MG PO TABS
4.0000 mg | ORAL_TABLET | Freq: Four times a day (QID) | ORAL | 0 refills | Status: AC
Start: 2023-07-12 — End: ?

## 2023-07-12 MED ORDER — HYDROCODONE-ACETAMINOPHEN 5-325 MG PO TABS: 1.0000 | ORAL_TABLET | Freq: Four times a day (QID) | ORAL | 0 refills | Status: AC | PRN

## 2023-07-12 MED ORDER — ONDANSETRON HCL 4 MG/2ML IJ SOLN
4.0000 mg | Freq: Once | INTRAMUSCULAR | Status: AC
  Administered 2023-07-12: 4 mg via INTRAVENOUS
  Filled 2023-07-12: qty 2

## 2023-07-12 MED ORDER — IOHEXOL 300 MG/ML  SOLN
100.0000 mL | Freq: Once | INTRAMUSCULAR | Status: AC | PRN
  Administered 2023-07-12: 100 mL via INTRAVENOUS

## 2023-07-12 MED ORDER — MORPHINE SULFATE (PF) 4 MG/ML IV SOLN
4.0000 mg | Freq: Once | INTRAVENOUS | Status: AC
  Administered 2023-07-12: 4 mg via INTRAVENOUS
  Filled 2023-07-12: qty 1

## 2023-07-12 MED ORDER — MORPHINE SULFATE (PF) 4 MG/ML IV SOLN
4.0000 mg | Freq: Once | INTRAVENOUS | Status: DC
  Filled 2023-07-12: qty 1

## 2023-07-12 MED ORDER — KETOROLAC TROMETHAMINE 15 MG/ML IJ SOLN
15.0000 mg | Freq: Once | INTRAMUSCULAR | Status: AC
  Administered 2023-07-12: 15 mg via INTRAVENOUS
  Filled 2023-07-12: qty 1

## 2023-07-12 MED ORDER — SODIUM CHLORIDE 0.9 % IV BOLUS
1000.0000 mL | Freq: Once | INTRAVENOUS | Status: AC
  Administered 2023-07-12: 1000 mL via INTRAVENOUS

## 2023-07-12 NOTE — Discharge Instructions (Addendum)
 Evaluation today revealed you have a right-sided kidney stone.  Please follow-up with urology.  If you develop a fever, worsening flank pain or abdominal pain, painful urination or malodorous urine or any other concerning symptom please return to the ED for further evaluation.  I have sent Norco to your pharmacy for pain you can also take ibuprofen.  Also recommend that you take Flomax which will help kidney stone to pass.

## 2023-07-12 NOTE — ED Provider Notes (Cosign Needed Addendum)
 [place] EM[facility]Y DEPARTMENT AT [facility] Provider Note   CSN: [id number] Arrival date & time: 07/12/23  1920     History  Chief Complaint  Patient presents with   Abdominal Pain   HPI Lawrence Giles is a 78 y.o. male with history of asthma, high cholesterol, prostate surgery, GERD, hypertension presenting for abdominal pain.  Started around 1 AM this morning.  States he was out of town this weekend at a wedding and on his way back home started to feel pain in the right lower quadrant of his abdomen.  Also started to have vomiting with nausea.  States he felt he has been more constipated in the last couple days but did have a bowel movement 2 days ago.  Also reports that his blood pressure has been really high last day and that he was not able to take his blood pressure medication because of the vomiting.  Denies chest pain or shortness of breath and headache.  Denies urinary symptoms.   Abdominal Pain      Home Medications Prior to Admission medications   Medication Sig Start Date End Date Taking? Authorizing Provider  HYDROcodone-acetaminophen (NORCO/VICODIN) 5-325 MG tablet Take 1 tablet by mouth every 6 (six) hours as needed. 07/12/23  Yes Jaquelin Meaney K, PA-C  tamsulosin (FLOMAX) 0.4 MG CAPS capsule Take 1 capsule (0.4 mg total) by mouth daily. 07/12/23 08/11/23 Yes Verdia Glad, Deylan Canterbury K, PA-C  ALBERTSONS FERROUS SULFATE PO Take 325 mg by mouth.    [provider]  albuterol  (PROAIR  HFA) 108 (90 Base) MCG/ACT inhaler Inhale 2 puffs into the lungs every 4 (four) hours as needed for wheezing or shortness of breath. 06/25/17   Jule Nyhan, MD  albuterol  (PROVENTIL  HFA;VENTOLIN  HFA) 108 (90 Base) MCG/ACT inhaler Inhale 2 puffs into the lungs every 4 (four) hours as needed for wheezing or shortness of breath. 06/09/16   Jule Nyhan, MD  aspirin EC 81 MG tablet Take 81 mg by mouth.    [provider]  azelastine (ASTELIN) 0.1 % nasal spray Place into the  nose. 05/13/16   [provider]  cetirizine (ZYRTEC) 10 MG tablet Take 10 mg by mouth daily.    [provider]  fluconazole (DIFLUCAN) 150 MG tablet  04/08/16   [provider]  LUMIGAN 0.01 % SOLN  05/28/16   [provider]  meclizine (ANTIVERT) 25 MG tablet TAKE 1 TABLET 4 TIMES DAILY AS NEEDED FOR VERTIGO 05/05/16   [provider]  montelukast  (SINGULAIR ) 10 MG tablet Take 1 tablet (10 mg total) by mouth at bedtime. 01/05/17   Jule Nyhan, MD  omeprazole (PRILOSEC) 20 MG capsule daily. 01/11/15   [provider]  rosuvastatin (CRESTOR) 10 MG tablet TAKE 1 TABLET AT BEDTIME. 04/21/16   [provider]  silodosin (RAPAFLO) 8 MG CAPS capsule Take 8 mg by mouth. 01/08/16   [provider]  testosterone cypionate (DEPOTESTOSTERONE CYPIONATE) 200 MG/ML injection Inject 200 mg into the muscle. 07/13/15   [provider]  Turmeric Curcumin 500 MG CAPS Take by mouth.    [provider]  UNABLE TO FIND CPAP    [provider]  vitamin B-12 (CYANOCOBALAMIN) 100 MCG tablet Take by mouth.    [provider]  zolpidem (AMBIEN) 10 MG tablet Take 10 mg by mouth.    [provider]      Allergies    Patient has no known allergies.    Review of Systems  Review of Systems  Gastrointestinal:  Positive for abdominal pain.    Physical Exam Updated Vital Signs BP (!) 202/93   Pulse (!) 59   Temp 97.9 F (36.6 C)   Resp 20   Ht 5\' 9"  (1.753 m)   Wt 83.9 kg   SpO2 95%   BMI 27.32 kg/m  Physical Exam Vitals and nursing note reviewed.  HENT:     Head: Normocephalic and atraumatic.     Mouth/Throat:     Mouth: Mucous membranes are moist.  Eyes:     General:        Right eye: No discharge.        Left eye: No discharge.     Conjunctiva/sclera: Conjunctivae normal.  Cardiovascular:     Rate and Rhythm: Normal rate and regular rhythm.     Pulses: Normal pulses.     Heart sounds:  Normal heart sounds.  Pulmonary:     Effort: Pulmonary effort is normal.     Breath sounds: Normal breath sounds.  Abdominal:     General: Abdomen is flat.     Palpations: Abdomen is soft.     Tenderness: There is abdominal tenderness in the right lower quadrant.  Skin:    General: Skin is warm and dry.  Neurological:     General: No focal deficit present.  Psychiatric:        Mood and Affect: Mood normal.     ED Results / Procedures / Treatments   Labs (all labs ordered are listed, but only abnormal results are displayed) Labs Reviewed  CBC WITH DIFFERENTIAL/PLATELET - Abnormal; Notable for the following components:      Result Value   WBC 16.1 (*)    Neutro Abs 13.6 (*)    Monocytes Absolute 1.1 (*)    Abs Immature Granulocytes 0.08 (*)    All other components within normal limits  COMPREHENSIVE METABOLIC PANEL WITH GFR - Abnormal; Notable for the following components:   Glucose, Bld 120 (*)    BUN 29 (*)    All other components within normal limits  URINALYSIS, ROUTINE W REFLEX MICROSCOPIC - Abnormal; Notable for the following components:   Hgb urine dipstick SMALL (*)    Ketones, ur 15 (*)    Protein, ur 30 (*)    All other components within normal limits  URINALYSIS, MICROSCOPIC (REFLEX) - Abnormal; Notable for the following components:   Bacteria, UA RARE (*)    All other components within normal limits  LIPASE, BLOOD    EKG None  Radiology CT ABDOMEN PELVIS W CONTRAST Result Date: 07/12/2023 CLINICAL DATA:  Right lower quadrant abdominal pain. Nausea and vomiting. EXAM: CT ABDOMEN AND PELVIS WITH CONTRAST TECHNIQUE: Multidetector CT imaging of the abdomen and pelvis was performed using the standard protocol following bolus administration of intravenous contrast. RADIATION DOSE REDUCTION: This exam was performed according to the departmental dose-optimization program which includes automated exposure control, adjustment of the mA and/or kV according to patient  size and/or use of iterative reconstruction technique. CONTRAST:  100mL OMNIPAQUE IOHEXOL 300 MG/ML  SOLN COMPARISON:  Abdominal radiograph 08/04/2019 FINDINGS: Lower chest: Lung bases are clear.  Small esophageal hiatal hernia. Hepatobiliary: No focal liver abnormality is seen. No gallstones, gallbladder wall thickening, or biliary dilatation. Pancreas: Unremarkable. No pancreatic ductal dilatation or surrounding inflammatory changes. Spleen: Normal in size without focal abnormality. Adrenals/Urinary Tract: No adrenal gland nodules. Bilateral intrarenal stones with a single stone on the left and multiple stones on the right.  Largest right renal stone measures about 4 mm diameter. Mild right hydronephrosis and hydroureter to the level of the iliac crests were there is a 3 mm right ureteral stone. Bladder is normal. Stomach/Bowel: Stomach, small bowel, and colon are not abnormally distended. No wall thickening or inflammatory changes. Colonic diverticulosis without evidence of acute diverticulitis. Appendix is normal. Vascular/Lymphatic: Aortic atherosclerosis. No enlarged abdominal or pelvic lymph nodes. Reproductive: Prostate is unremarkable. Other: No free air or free fluid in the abdomen. Small periumbilical hernia containing fat. Musculoskeletal: Degenerative changes in the spine. No acute bony abnormalities. IMPRESSION: 1. 3 mm stone in the mid right ureter with moderate proximal obstruction. 2. Bilateral nonobstructing intrarenal stones. 3. Aortic atherosclerosis. 4. No evidence of bowel obstruction.  Normal appendix. 5. Small esophageal hiatal hernia. Electronically Signed   By: Boyce Byes M.D.   On: 07/12/2023 22:36    Procedures Procedures    Medications Ordered in ED Medications  ondansetron (ZOFRAN) injection 4 mg (4 mg Intravenous Given 07/12/23 2105)  sodium chloride 0.9 % bolus 1,000 mL ( Intravenous Stopped 07/12/23 2206)  morphine (PF) 4 MG/ML injection 4 mg (4 mg Intravenous Given  07/12/23 2106)  iohexol (OMNIPAQUE) 300 MG/ML solution 100 mL (100 mLs Intravenous Contrast Given 07/12/23 2229)  hydrALAZINE (APRESOLINE) injection 5 mg (5 mg Intravenous Given 07/12/23 2300)  ondansetron (ZOFRAN) injection 4 mg (4 mg Intravenous Given 07/12/23 2256)  ketorolac (TORADOL) 15 MG/ML injection 15 mg (15 mg Intravenous Given 07/12/23 2258)  losartan (COZAAR) tablet 50 mg (50 mg Oral Given 07/12/23 2329)    ED Course/ Medical Decision Making/ A&P                                 Medical Decision Making Amount and/or Complexity of Data Reviewed Labs: ordered. Radiology: ordered.  Risk Prescription drug management.   Initial Impression and Ddx 78 year old well-appearing male presenting for abdominal pain.  Exam notable for right lower quadrant tenderness.  DDx includes appendicitis, kidney stone, pyelonephritis, bowel obstruction, other. Patient PMH that increases complexity of ED encounter: asthma, high cholesterol, prostate surgery, GERD, hypertension  Interpretation of Diagnostics - I independent reviewed and interpreted the labs as followed: leukocytosis, hematuria  - I independently visualized the following imaging with scope of interpretation limited to determining acute life threatening conditions related to emergency care: CT abdomen pelvis, which revealed  3 mm stone in the mid right ureter with moderate proximal obstruction. 2. Bilateral nonobstructing intrarenal stones.  Patient Reassessment and Ultimate Disposition/Management Workup suggestive of kidney stone with moderate associated obstruction, however renal function appears to be preserve and urinalysis not convincing for associated infection.  On reassessment his pain is well-controlled after morphine and Toradol.  Sent Flomax and Norco and zofran to his pharmacy. Also blood pressure was noted to be elevated during this encounter.  Suspect it is because he has not been able to take his blood pressure medications due to  vomiting.  Treated with IV hydralazine and then his home dose of losartan. Last blood pressure the systolic was in the 150s and he on reassessment did not endorse symptoms associated with hypertensive emergency. Fluid challenge without complication.  Advised him to follow-up with urology and to continue his blood pressure medications as prescribed.  Discussed return precautions.  Discharged.   Patient management required discussion with the following services or consulting groups:  None  Complexity of Problems Addressed Acute complicated illness or Injury  Additional  Data Reviewed and Analyzed Further history obtained from: Past medical history and medications listed in the EMR and Prior ED visit notes  Patient Encounter Risk Assessment Prescriptions and Consideration of hospitalization    Final Clinical Impression(s) / ED Diagnoses Final diagnoses:  Kidney stone    Rx / DC Orders ED Discharge Orders          Ordered    HYDROcodone-acetaminophen (NORCO/VICODIN) 5-325 MG tablet  Every 6 hours PRN        07/12/23 2341    tamsulosin (FLOMAX) 0.4 MG CAPS capsule  Daily        07/12/23 2341              Janalee Mcmurray, PA-C 07/12/23 2344    Janalee Mcmurray, PA-C 07/12/23 2351    Dalene Duck, MD 07/13/23 1601

## 2023-07-12 NOTE — ED Triage Notes (Signed)
 Pt reports RLQ pain that started early this morning; had some NV intermittently
